# Patient Record
Sex: Female | Born: 1970 | Race: White | Hispanic: No | Marital: Married | Smoking: Former smoker
Health system: Southern US, Community
[De-identification: ages and names within clinical notes are randomized; demographics above are authoritative.]

## PROBLEM LIST (undated history)

## (undated) ENCOUNTER — Inpatient Hospital Stay (HOSPITAL_COMMUNITY): Payer: Self-pay

## (undated) DIAGNOSIS — R002 Palpitations: Secondary | ICD-10-CM

## (undated) HISTORY — PX: CHOLECYSTECTOMY: SHX55

## (undated) HISTORY — PX: TONSILLECTOMY: SUR1361

---

## 2007-02-06 ENCOUNTER — Encounter: Admission: RE | Admit: 2007-02-06 | Discharge: 2007-02-06 | Payer: Self-pay | Admitting: Family Medicine

## 2008-07-27 ENCOUNTER — Encounter: Admission: RE | Admit: 2008-07-27 | Discharge: 2008-07-27 | Payer: Self-pay | Admitting: Legal Medicine

## 2008-09-02 ENCOUNTER — Inpatient Hospital Stay (HOSPITAL_COMMUNITY): Admission: AD | Admit: 2008-09-02 | Discharge: 2008-09-02 | Payer: Self-pay | Admitting: Obstetrics and Gynecology

## 2009-01-21 ENCOUNTER — Ambulatory Visit (HOSPITAL_COMMUNITY): Admission: RE | Admit: 2009-01-21 | Discharge: 2009-01-21 | Payer: Self-pay | Admitting: Obstetrics and Gynecology

## 2009-03-18 ENCOUNTER — Ambulatory Visit (HOSPITAL_COMMUNITY): Admission: RE | Admit: 2009-03-18 | Discharge: 2009-03-18 | Payer: Self-pay | Admitting: Obstetrics and Gynecology

## 2009-04-08 ENCOUNTER — Ambulatory Visit (HOSPITAL_COMMUNITY): Admission: RE | Admit: 2009-04-08 | Discharge: 2009-04-08 | Payer: Self-pay | Admitting: Obstetrics and Gynecology

## 2009-04-27 ENCOUNTER — Inpatient Hospital Stay (HOSPITAL_COMMUNITY): Admission: AD | Admit: 2009-04-27 | Discharge: 2009-05-01 | Payer: Self-pay | Admitting: Obstetrics and Gynecology

## 2010-02-06 ENCOUNTER — Encounter: Payer: Self-pay | Admitting: Family Medicine

## 2010-04-06 LAB — CBC
HCT: 29.9 % — ABNORMAL LOW (ref 36.0–46.0)
HCT: 37.7 % (ref 36.0–46.0)
Hemoglobin: 10.4 g/dL — ABNORMAL LOW (ref 12.0–15.0)
MCHC: 34.3 g/dL (ref 30.0–36.0)
MCHC: 34.8 g/dL (ref 30.0–36.0)
MCV: 95.4 fL (ref 78.0–100.0)
Platelets: 133 10*3/uL — ABNORMAL LOW (ref 150–400)
RBC: 3.14 MIL/uL — ABNORMAL LOW (ref 3.87–5.11)
RDW: 12.4 % (ref 11.5–15.5)

## 2010-04-23 LAB — DIFFERENTIAL
Basophils Absolute: 0 10*3/uL (ref 0.0–0.1)
Basophils Relative: 0 % (ref 0–1)
Eosinophils Relative: 0 % (ref 0–5)
Lymphocytes Relative: 13 % (ref 12–46)

## 2010-04-23 LAB — CBC
HCT: 41.1 % (ref 36.0–46.0)
Hemoglobin: 14.2 g/dL (ref 12.0–15.0)
MCHC: 34.7 g/dL (ref 30.0–36.0)
MCV: 93.1 fL (ref 78.0–100.0)
RBC: 4.41 MIL/uL (ref 3.87–5.11)
RDW: 13.1 % (ref 11.5–15.5)

## 2010-04-23 LAB — RH IMMUNE GLOBULIN WORKUP (NOT WOMEN'S HOSP)

## 2010-04-23 LAB — HEPATITIS B SURFACE ANTIGEN: Hepatitis B Surface Ag: NEGATIVE

## 2010-05-29 ENCOUNTER — Emergency Department (HOSPITAL_BASED_OUTPATIENT_CLINIC_OR_DEPARTMENT_OTHER)
Admission: EM | Admit: 2010-05-29 | Discharge: 2010-05-29 | Disposition: A | Discharge status: Other Acute Inpatient Hospital | Payer: BC Managed Care – PPO | Source: Home / Self Care | Attending: Emergency Medicine | Admitting: Emergency Medicine

## 2010-05-29 ENCOUNTER — Inpatient Hospital Stay (HOSPITAL_COMMUNITY)
Admission: AD | Admit: 2010-05-29 | Discharge: 2010-06-03 | DRG: 494 | Disposition: A | Discharge status: Home / Self Care | Payer: BC Managed Care – PPO | Source: Other Acute Inpatient Hospital | Attending: General Surgery | Admitting: General Surgery

## 2010-05-29 DIAGNOSIS — Z880 Allergy status to penicillin: Secondary | ICD-10-CM

## 2010-05-29 DIAGNOSIS — R1013 Epigastric pain: Secondary | ICD-10-CM | POA: Insufficient documentation

## 2010-05-29 DIAGNOSIS — K8070 Calculus of gallbladder and bile duct without cholecystitis without obstruction: Principal | ICD-10-CM | POA: Diagnosis present

## 2010-05-29 DIAGNOSIS — Z882 Allergy status to sulfonamides status: Secondary | ICD-10-CM

## 2010-05-29 DIAGNOSIS — K219 Gastro-esophageal reflux disease without esophagitis: Secondary | ICD-10-CM | POA: Diagnosis present

## 2010-05-29 DIAGNOSIS — K8010 Calculus of gallbladder with chronic cholecystitis without obstruction: Secondary | ICD-10-CM | POA: Insufficient documentation

## 2010-05-29 DIAGNOSIS — R748 Abnormal levels of other serum enzymes: Secondary | ICD-10-CM | POA: Insufficient documentation

## 2010-05-29 LAB — CBC
Hemoglobin: 14.3 g/dL (ref 12.0–15.0)
MCH: 31 pg (ref 26.0–34.0)
MCV: 88.1 fL (ref 78.0–100.0)
Platelets: 148 10*3/uL — ABNORMAL LOW (ref 150–400)
RBC: 4.61 MIL/uL (ref 3.87–5.11)

## 2010-05-29 LAB — DIFFERENTIAL
Eosinophils Relative: 0 % (ref 0–5)
Lymphocytes Relative: 13 % (ref 12–46)
Lymphs Abs: 1 10*3/uL (ref 0.7–4.0)
Monocytes Absolute: 0.3 10*3/uL (ref 0.1–1.0)
Monocytes Relative: 4 % (ref 3–12)

## 2010-05-29 LAB — COMPREHENSIVE METABOLIC PANEL
AST: 595 U/L — ABNORMAL HIGH (ref 0–37)
Albumin: 4.3 g/dL (ref 3.5–5.2)
BUN: 7 mg/dL (ref 6–23)
CO2: 26 mEq/L (ref 19–32)
Calcium: 9.7 mg/dL (ref 8.4–10.5)
Chloride: 103 mEq/L (ref 96–112)
Creatinine, Ser: 0.5 mg/dL (ref 0.4–1.2)
GFR calc non Af Amer: >60 mL/min (ref >60)
Total Bilirubin: 1.7 mg/dL — ABNORMAL HIGH (ref 0.3–1.2)

## 2010-05-30 ENCOUNTER — Inpatient Hospital Stay (HOSPITAL_COMMUNITY): Payer: BC Managed Care – PPO

## 2010-05-30 ENCOUNTER — Other Ambulatory Visit: Payer: Self-pay | Admitting: General Surgery

## 2010-05-30 LAB — MAGNESIUM: Magnesium: 1.8 mg/dL (ref 1.5–2.5)

## 2010-05-30 LAB — COMPREHENSIVE METABOLIC PANEL
Alkaline Phosphatase: 136 U/L — ABNORMAL HIGH (ref 39–117)
CO2: 26 mEq/L (ref 19–32)
Calcium: 9.1 mg/dL (ref 8.4–10.5)
Creatinine, Ser: 0.5 mg/dL (ref 0.4–1.2)
GFR calc non Af Amer: >60 mL/min (ref >60)
Glucose, Bld: 100 mg/dL — ABNORMAL HIGH (ref 70–99)
Sodium: 140 mEq/L (ref 135–145)
Total Bilirubin: 1.1 mg/dL (ref 0.3–1.2)

## 2010-05-30 LAB — CBC
HCT: 40.5 % (ref 36.0–46.0)
Hemoglobin: 14.1 g/dL (ref 12.0–15.0)
MCH: 31.1 pg (ref 26.0–34.0)
MCHC: 34.8 g/dL (ref 30.0–36.0)

## 2010-05-30 LAB — SURGICAL PCR SCREEN: Staphylococcus aureus: POSITIVE — AB

## 2010-05-30 LAB — BASIC METABOLIC PANEL
Creatinine, Ser: 0.64 mg/dL (ref 0.4–1.2)
GFR calc Af Amer: >60 mL/min (ref >60)
GFR calc non Af Amer: >60 mL/min (ref >60)
Sodium: 139 mEq/L (ref 135–145)

## 2010-05-31 LAB — COMPREHENSIVE METABOLIC PANEL
Albumin: 3.3 g/dL — ABNORMAL LOW (ref 3.5–5.2)
Alkaline Phosphatase: 111 U/L (ref 39–117)
BUN: 4 mg/dL — ABNORMAL LOW (ref 6–23)
Creatinine, Ser: 0.52 mg/dL (ref 0.4–1.2)
Glucose, Bld: 90 mg/dL (ref 70–99)
Potassium: 3.8 mEq/L (ref 3.5–5.1)
Total Bilirubin: 0.6 mg/dL (ref 0.3–1.2)
Total Protein: 6.2 g/dL (ref 6.0–8.3)

## 2010-05-31 NOTE — Op Note (Signed)
Heather, Yoder             ACCOUNT NO.:  [id number]  MEDICAL RECORD NO.:  [id number]           PATIENT TYPE:  I  LOCATION:  5120                         FACILITY:  [facility]  PHYSICIAN:  Sharlet Salina T. Wakeelah Solan, M.D.DATE OF BIRTH:  June 18, 1970  DATE OF PROCEDURE:  05/30/2010 DATE OF DISCHARGE:                              OPERATIVE REPORT   PREOPERATIVE DIAGNOSIS:  Cholelithiasis.  POSTOPERATIVE DIAGNOSIS:  Cholelithiasis.  SURGICAL PROCEDURES:  Laparoscopic cholecystectomy with intraoperative cholangiogram and balloon catheter common bile duct exploration.  SURGEON:  Lorne Skeens. Sarahi Borland, MD  ASSISTANT:  Letha Cape, PA  BRIEF HISTORY:  [name] is a 40 year old female who has had in the past intermittent epigastric abdominal pain.  She has had a previous ultrasound showing gallstones, but due to the timing and perimeno symptoms, she elected not to have surgery.  She now was admitted yesterday with acute severe epigastric abdominal pain.  Today, the pain is significantly improved.  Repeat gallbladder ultrasound has shown sludge.  LFTs were mildly elevated on admission with bilirubin of 1.7 down to 1.1 today, although her alkaline phosphatase is up from 116-136, SGPT is down from 595-293.  I have discussed with her that she appears to attack of biliary colic may have passed or may continue to have a common bile duct stone.  We discussed management and due to the patient's improved symptoms and generally improving LFTs and normal appearance of common bile duct on ultrasound, I recommended initially laparoscopic cholecystectomy with intraoperative cholangiogram.  The nature of the procedure, its indications, risks of bleeding, infection, bile leak, bile duct injury were discussed and understood.  Now, she was brought to the operating room for this procedure.  DESCRIPTION OF OPERATION:  The patient was brought to the operating room, placed in the supine position on the  operating table, and general orotracheal anesthesia was induced.  She received preoperative IV antibiotics.  PAS were in place.  The abdomen was widely sterilely prepped and draped.  Correct patient and procedure were verified. Access was obtained with a 1-cm incision in the umbilicus.  Dissection was carried down the midline.  Fascia was incised 1-cm.  The peritoneum entered under direct vision.  Through mattress suture of 0 Vicryl, the Hasson trocar was placed and the pneumoperitoneum was established. Under direct vision, a 5-mm trocar was placed subxiphoid and two 5-mm trocars along the right subcostal margin.  The gallbladder appeared slightly distended, but not severely acutely inflamed.  The fundus was grasped, elevated up over the liver and the infundibulum retracted inferolaterally.  Peritoneum anterior-posterior to Calot triangle was incised and distal gallbladder was thoroughly dissected.  The cystic artery was skeletonized and Calot triangle was seen coursing upon the gallbladder wall.  The cystic duct was dissected free dissected at 360 degrees of the gallbladder junction.  When the anatomy was clear, the cystic artery was clipped and the cystic duct was clipped at the gallbladder junction.  Operative cholangiogram was obtained through the cystic duct.  This showed mildly dilated intrahepatic bile ducts.  There were 2 clear filling defects mobile, one migrating distally toward the ampulla with some obstruction of the  contrast.  I elected to sweep the common bile duct with a balloon catheter, at this point safely cleared. A #4 Fogarty catheter was used through one of the 5-mm trocars and was introduced without difficulty through the cystic duct into the common bile duct.  I made several passes with the balloon back up through the ampulla and then passing the catheter back down into the duodenum.  The balloon was pulled back out through the cystic duct couple of times I did not  retrieve any stones.  At this point, the Cholangiocath wall was reinserted into the cystic duct and cholangiogram repeated.  The distal common bile duct at this point appeared clear with no filling defects and good prompt flow into the duodenum.  There appeared to me to be air in the common hepatic duct rather than a definite stone.  There was no undue back pressure on the common duct.  The Cholangiocath was removed and the cystic duct was triply clipped proximally and divided, and the cystic artery previously clipped was divided.  The gallbladder was then dissected free from its bed using hook cautery, placed in an EndoCatch bag and brought out through the umbilical incision.  The right upper quadrant was irrigated and complete hemostasis assured.  Trocars were removed and all CO2 evacuated.  The mattress was suture secured in the umbilicus.  Skin incisions were closed with subcuticular Monocryl and Dermabond.  Sponge, needle, and instruments counts correct.  The patient was taken recovery in good condition.     Lorne Skeens. Savita Runner, M.D.     Tory Emerald  D:  05/30/2010  T:  05/31/2010  Job:  [id number]  Electronically Signed by Glenna Fellows M.D. on 05/31/2010 04:42:48 PM

## 2010-06-01 DIAGNOSIS — R17 Unspecified jaundice: Secondary | ICD-10-CM

## 2010-06-01 DIAGNOSIS — K8050 Calculus of bile duct without cholangitis or cholecystitis without obstruction: Secondary | ICD-10-CM

## 2010-06-01 DIAGNOSIS — R932 Abnormal findings on diagnostic imaging of liver and biliary tract: Secondary | ICD-10-CM

## 2010-06-01 LAB — CBC
Hemoglobin: 14.3 g/dL (ref 12.0–15.0)
MCV: 90 fL (ref 78.0–100.0)
Platelets: 145 10*3/uL — ABNORMAL LOW (ref 150–400)
RBC: 4.61 MIL/uL (ref 3.87–5.11)
WBC: 6.3 10*3/uL (ref 4.0–10.5)

## 2010-06-01 LAB — COMPREHENSIVE METABOLIC PANEL
AST: 272 U/L — ABNORMAL HIGH (ref 0–37)
Albumin: 3.6 g/dL (ref 3.5–5.2)
Alkaline Phosphatase: 194 U/L — ABNORMAL HIGH (ref 39–117)
CO2: 27 mEq/L (ref 19–32)
Chloride: 102 mEq/L (ref 96–112)
GFR calc Af Amer: >60 mL/min (ref >60)
GFR calc non Af Amer: >60 mL/min (ref >60)
Potassium: 3.7 mEq/L (ref 3.5–5.1)
Total Bilirubin: 4.1 mg/dL — ABNORMAL HIGH (ref 0.3–1.2)

## 2010-06-02 ENCOUNTER — Inpatient Hospital Stay (HOSPITAL_COMMUNITY): Payer: BC Managed Care – PPO

## 2010-06-02 DIAGNOSIS — R932 Abnormal findings on diagnostic imaging of liver and biliary tract: Secondary | ICD-10-CM

## 2010-06-02 DIAGNOSIS — R740 Nonspecific elevation of levels of transaminase and lactic acid dehydrogenase [LDH]: Secondary | ICD-10-CM

## 2010-06-02 DIAGNOSIS — R7401 Elevation of levels of liver transaminase levels: Secondary | ICD-10-CM

## 2010-06-02 LAB — COMPREHENSIVE METABOLIC PANEL
BUN: 9 mg/dL (ref 6–23)
Calcium: 9.9 mg/dL (ref 8.4–10.5)
Creatinine, Ser: 0.62 mg/dL (ref 0.4–1.2)
Glucose, Bld: 89 mg/dL (ref 70–99)
Total Protein: 7.4 g/dL (ref 6.0–8.3)

## 2010-06-03 DIAGNOSIS — K8050 Calculus of bile duct without cholangitis or cholecystitis without obstruction: Secondary | ICD-10-CM

## 2010-06-14 NOTE — Discharge Summary (Signed)
NAMESANIKA, BROSIOUS             ACCOUNT NO.:  [id number]  MEDICAL RECORD NO.:  [id number]           PATIENT TYPE:  I  LOCATION:  5120                         FACILITY:  [facility]  PHYSICIAN:  Sharlet Salina T. Zoeya Gramajo, M.D.DATE OF BIRTH:  [birth year]  DATE OF ADMISSION:  05/29/2010 DATE OF DISCHARGE:  05/31/2010                              DISCHARGE SUMMARY   ADMISSION DIAGNOSES:  Symptomatic cholelithiasis, possible choledocholithiasis with elevated LFTs.  DISCHARGE DIAGNOSES:  Symptomatic cholelithiasis, possible choledocholithiasis with elevated LFTs.  PROCEDURES:  Laparoscopic cholecystectomy with intraoperative cholangiogram and wound catheter, common bile duct exploration.  BRIEF HISTORY:  The patient is a 40 year old female with a history of gallstones since November 2011.  She was seen at [facility] and diagnosed with an ultrasound.  She was breastfeeding at that time and did want to reduce the risk in her milk supply with surgery.  She changed her diet and did not have any further episodes.  She has been fine until the last 2-3 weeks.  When she started having significant heartburn, bloating, and epigastric discomfort, she also had discomfort with nausea but no vomiting, fever or chills.  She was seen in the [facility] at [facility] where she was in a lot of distress.  She was transferred to [facility] and admitted by Dr. Donell Beers.  Dr. Donell Beers noted that her bilirubin was 1.7, alk phos was 116.  SGOT was 595 and SGPT was 486. The patient was admitted, placed on antibiotics for further workup and evaluation as needed.  An abdominal ultrasound was ordered along with followup labs.  PAST MEDICAL HISTORY:  Significant for gallstones and headaches.  PAST SURGICAL HISTORY:  Tonsils and adenoids, C-section, and wisdom teeth removal.  SOCIAL HISTORY:  Negative for substance use.  She is married, has a 16- year-old son.  MEDICATIONS ON ADMISSION:  Prenatal  vitamins.  ALLERGIES:  PENICILLIN, CODEINE and SULFA.  For further history and physical, please see the dictated note.  HOSPITAL COURSE:  The patient was admitted, placed on antibiotics, underwent abdominal ultrasound which showed sludge is present. Gallbladder measured 3 mm normal.  There was no sonographic Murphy sign. No pericholecystic fluid.  Common bile duct was normal.  Bilirubin came down to 1.1, alk phos was 136.  SGOT improved to 293 but SGPT was up to 548.  The patient was seen on the a.m. of May 30, 2010.  Dr. Johna Sheriff reviewed the study and recommended going forward with laparoscopic cholecystectomy.  The patient agreed.  She was taken to the OR that day and underwent laparoscopic cholecystectomy as described above.  On the intraoperative arteriogram showed mildly dilated intrahepatic ducts. There were 2 clear filling defects, one migrating distally toward the ampulla with some obstruction of contrast and Dr. Johna Sheriff elected to sweep the common bile duct with a balloon catheter.  Then a repeat cholangiogram was performed.  The reading on the cholangiogram by radiology showed air injected during the cholangiogram limited the evaluation.  However on the last runs admitted there was no distal common bile duct stones visualized.  There was narrowing of the common bile duct, may be normal that represent  spasm requiring a followup if there are persistent liver function studies.  Following a.m., the patient is eating a regular diet for supper without problems.  She was feeling much better.  Her LFTs were improving.  Total bilirubin was down to 0.6, alk phos was 111, SGOT was 91, SGPT was down to 342.  She was seen and examined by Dr. Johna Sheriff, it was his opinion. She tolerated her diet well.  She could be mobilized, discharged home. She works as a Engineer, civil (consulting) and we will plan to let her return in 1-2 weeks.  DISCHARGE ACTIVITY:  Light to moderate, no lifting over 10 pounds.   No driving while she is on Percocet.  DISCHARGE MEDICATIONS:  She will resume her preadmission prenatal vitamins, Percocet 1-2 p.o. q.4 h. P.r.n. and/or Tylenol 1-2 p.o. q.4 h. P.r.n.  She will return to see Dr. Johna Sheriff on:  June 1 at 4:30 p.m.  Wound instructions, she may wash her wounds with plain soap and water. They were closed subcuticularly with glue.  She is instructed to remove that in a week if does not come off during bathing.  She was instructed there still possibility there might be a stone and they were although was very small.  If she has a recurrence of her pain, she is to call for followup at that point.     Eber Hong, P.A.   ______________________________ Lorne Skeens. Rylin Seavey, M.D.    WDJ/MEDQ  D:  05/31/2010  T:  05/31/2010  Job:  [id number]  cc:   Malva Limes, M.D.  Electronically Signed by Sherrie George P.A. on 06/05/2010 08:42:43 PM Electronically Signed by Glenna Fellows M.D. on 06/14/2010 10:06:26 AM

## 2010-06-14 NOTE — Discharge Summary (Signed)
  NAMELAGENA, Heather Yoder             ACCOUNT NO.:  [id number]  MEDICAL RECORD NO.:  [id number]           PATIENT TYPE:  I  LOCATION:  5120                         FACILITY:  [facility]  PHYSICIAN:  Sharlet Salina T. Duc Crocket, M.D.DATE OF BIRTH:  1970/02/24  DATE OF ADMISSION:  05/29/2010 DATE OF DISCHARGE:  06/03/2010                              DISCHARGE SUMMARY   DISCHARGE DIAGNOSES: 1. Cholelithiasis. 2. History of headaches. 3. Hyperbilirubinemia.  CONSULTANTS:  Malcolm T. Russella Dar, MD, [facility], for GI.  PROCEDURES: 1. Laparoscopic cholecystectomy with intraoperative cholangiogram and     common bile duct exploration by Dr. Johna Sheriff. 2. ERCP by Dr. Russella Dar.  HISTORY OF PRESENT ILLNESS:  This is a 40 year old white female with a history of symptomatic cholelithiasis.  She was trying to put off surgery but had a severe episode and came in for evaluation.  She was admitted and proceeded with lap chole the following day.  Following that, she had some problems with pain and ileus, etc.  Labs were checked.  Her transaminases were found to be trending up including a significant rise in her bilirubinemia.  GI was consulted and they proceeded with ERCP for presumed common duct stone.  They did not find a stone.  She probably had some sphincter of Oddi spasm.  On the following day, she was much improved and was able to be discharged home in good condition.  DISCHARGE MEDICATIONS:  Percocet 5/325, take one to two p.o. q.4 h. p.r.n. pain, #40 with no refill.  CONDITION:  She may resume her prenatal vitamin.  FOLLOWUP:  The patient will need to follow up in the [facility] on June 18, 2010.  She will get LFTs done 3 days prior to that.  If she has questions or concerns, she will call.    Earney Hamburg, P.A.   ______________________________ Lorne Skeens. Claire Bridge, M.D.   MJ/MEDQ  D:  06/03/2010  T:  06/03/2010  Job:  [id number]  Electronically Signed by Charma Igo P.A. on 06/10/2010  02:25:11 PM Electronically Signed by Glenna Fellows M.D. on 06/14/2010 10:06:29 AM

## 2010-06-20 NOTE — H&P (Signed)
NAMESHAINDEL, Heather Yoder             ACCOUNT NO.:  [id number]  MEDICAL RECORD NO.:  [id number]           PATIENT TYPE:  I  LOCATION:  5120                         FACILITY:  [facility]  PHYSICIAN:  Almond Lint, MD       DATE OF BIRTH:  [birth year]  DATE OF ADMISSION:  05/29/2010 DATE OF DISCHARGE:                             HISTORY & PHYSICAL   CHIEF COMPLAINT:  Abdominal pain.  HISTORY OF PRESENT ILLNESS:  Heather Yoder is a 40 year old female who has had gallstones known since November 2011.  She went to the ER at [facility] and was diagnosed with gallstones on ultrasound.  She was breastfeeding at this time and did not want to risk reducing her milk supply with surgery.  She changed her diet around at the time and did not have anymore episodes.  She has really been fine until last 2-3 weeks when she started again with significant heartburn, bloating, and epigastric discomfort.    She has had nausea but no vomiting and no fevers or chills.  She went to the ER at [facility] last night for a severe attack in which she felt like she was dying.  PAST MEDICAL HISTORY:  Significant for gallstones and headache.  PAST SURGICAL HISTORY:  Tonsillectomy, adenoidectomy, C-section, and wisdom teeth removal.  FAMILY HISTORY:  Her father and her maternal grandfather both had gallbladder disease.  SOCIAL HISTORY:  She does not abuse substances.  She is married and has a 55-year-old son.  REVIEW OF SYSTEMS:  Otherwise negative.  MEDICATIONS:  Prenatal vitamins.  ALLERGIES:  PENICILLIN, CODEINE, and SULFA.  PHYSICAL EXAMINATION:  VITAL SIGNS:  Temperature is 98.3, heart rate is 72, blood pressure 123/58, respiratory rate 22, sats 100%. GENERAL:  Alert, oriented x3 in no acute distress. HEENT:  Normocephalic, atraumatic.  Sclerae anicteric.  Mucous membranes were moist. NECK:  Supple.  No lymphadenopathy.  No thyromegaly.  Trachea is midline. PSYCH:  Mood and affect are  normal. HEART:  Regular. LUNGS:  Clear. ABDOMEN:  Soft, nondistended.  No hepatosplenomegaly.  Tenderness in the epigastrium and right upper quadrant. EXTREMITIES:  Warm and well perfused without pitting edema.  Good strength and sensation in all 4 extremities.  Sodium 139, potassium 3.8, chloride 103, CO2 of 26, BUN 7, creatinine 0.5, glucose 108, bilirubin 1.7, ALT 46, alk phos 116.  White count 7.6, hemoglobin 14.3, hematocrit 40.6, platelet count 148,000, lipase 24.  DIAGNOSTICS:  We do not have any ultrasound here.  IMPRESSION:  Heather Yoder is a 40 year old female with symptomatic cholelithiasis, possible choledocholithiasis and suspected cholecystitis.  Her liver function tests appear that she has passed a stone.  PLAN:  IV fluids, IV antibiotics, recheck LFTs in the morning.  Right upper quadrant ultrasound to evaluate her ductal dilatation.  If her LFTs go up, then we will consult GI for possible ERCP.  If her LFTs go down, she would likely undergo laparoscopic cholecystectomy with cholangiogram tomorrow.  I will discuss this issue with Dr. Johna Sheriff in the morning who is taking over doctor of the week service.     Almond Lint, MD     FB/MEDQ  D:  05/29/2010  T:  05/30/2010  Job:  [id number]  Electronically Signed by Almond Lint MD on 06/20/2010 04:54:09 PM

## 2010-06-21 ENCOUNTER — Encounter (INDEPENDENT_AMBULATORY_CARE_PROVIDER_SITE_OTHER): Payer: Self-pay | Admitting: General Surgery

## 2011-01-19 ENCOUNTER — Other Ambulatory Visit: Payer: Self-pay | Admitting: Obstetrics & Gynecology

## 2011-07-30 IMAGING — US US OB FOLLOW-UP
1 series · 18 of 28 positions shown · non-contrast
Comparison: none

OBSTETRICAL ULTRASOUND:
 This ultrasound was performed in The [HOSPITAL], and the [facility] OB/GYN report will be stored to [REDACTED] PACS.  This report is also available in [HOSPITAL]?s accessANYware.

[Series 1: us ob follow-up · 18 of 36 slices shown]
[im 1/36]
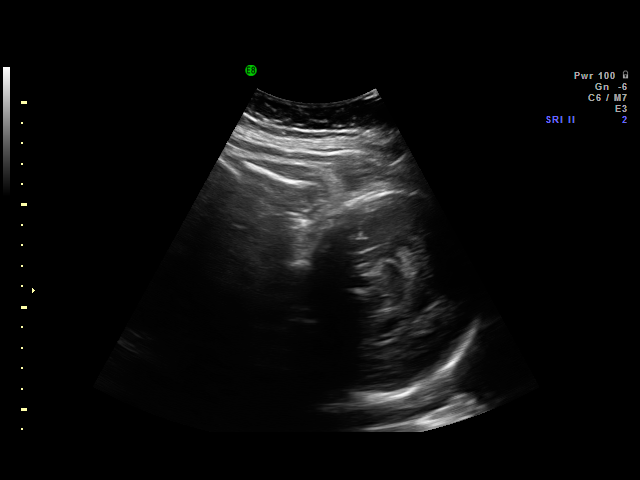
[im 3/36]
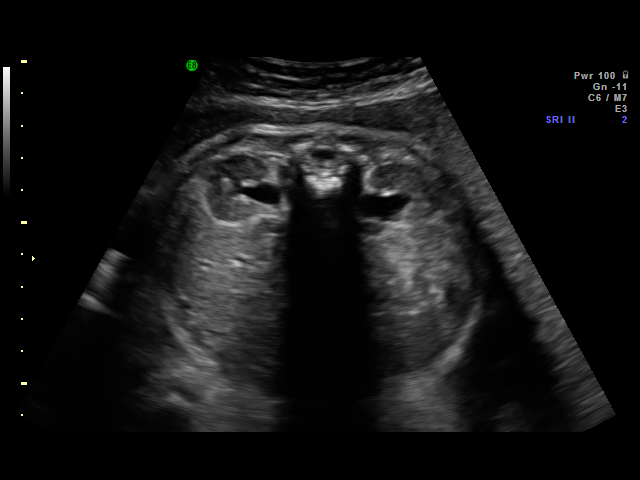
[im 4/36]
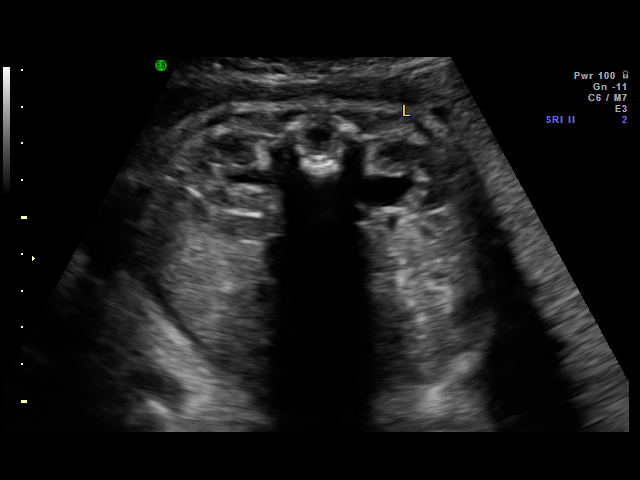
[im 7/36]
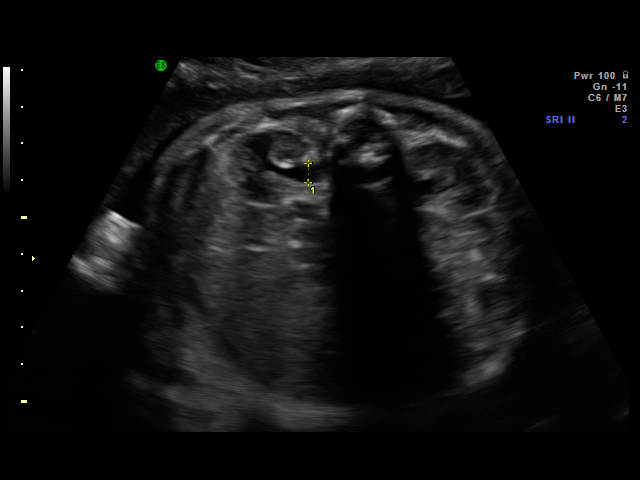
[im 10/36]
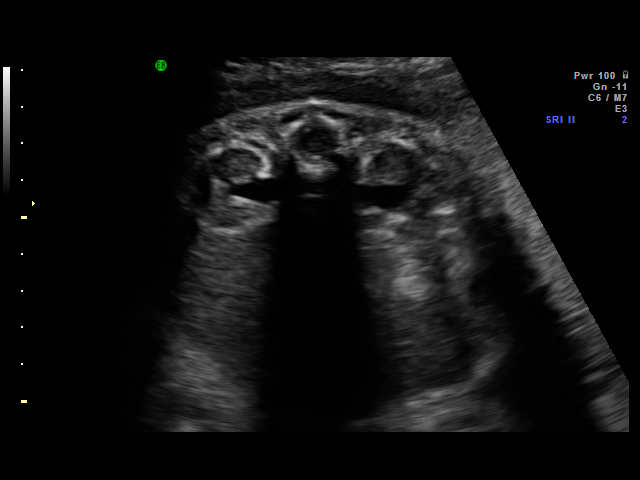
[im 11/36]
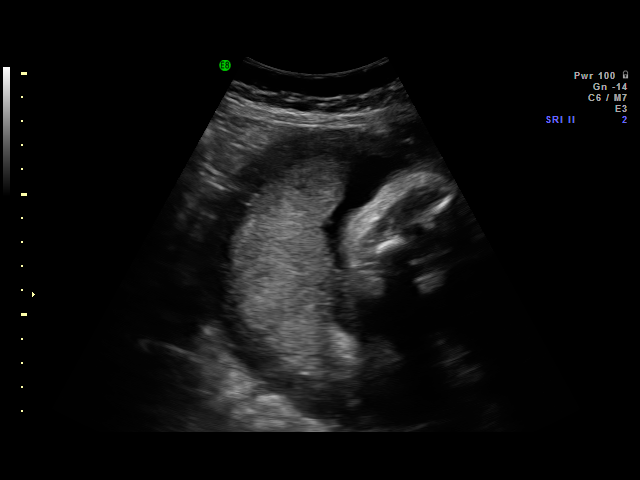
[im 13/36]
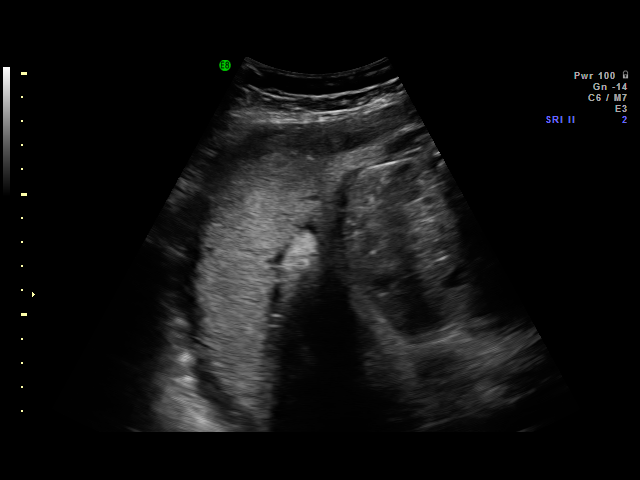
[im 15/36]
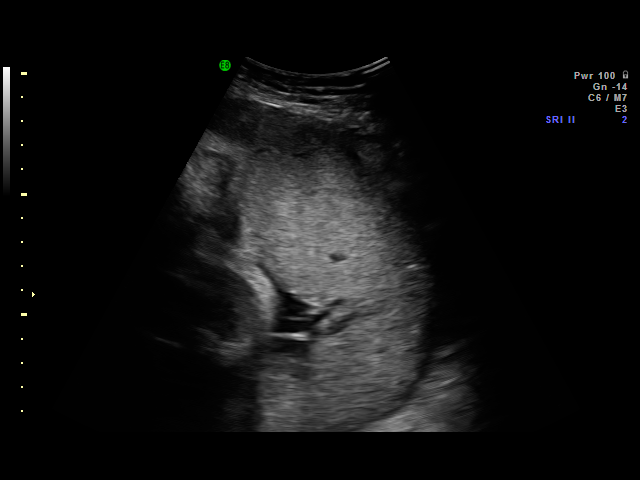
[im 17/36]
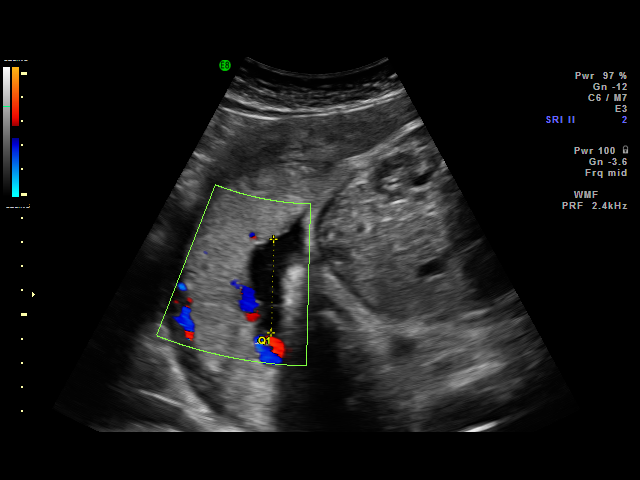
[im 19/36]
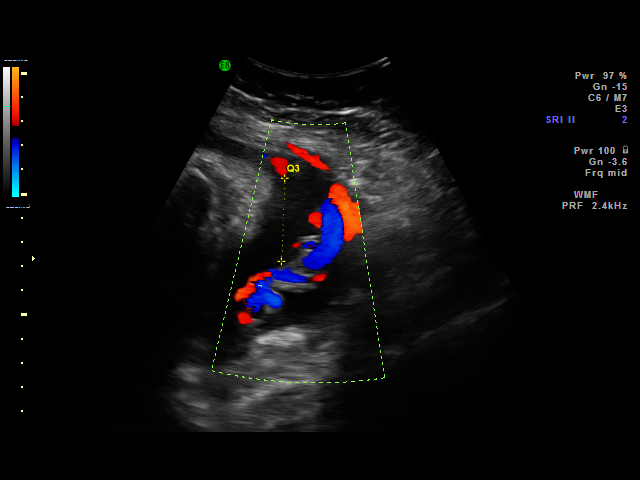
[im 21/36]
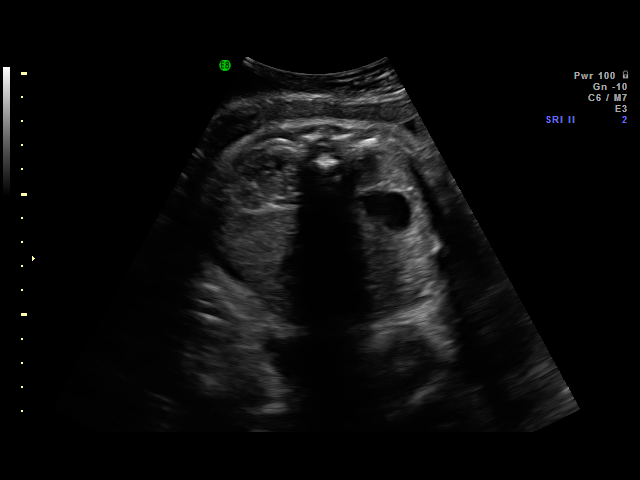
[im 23/36]
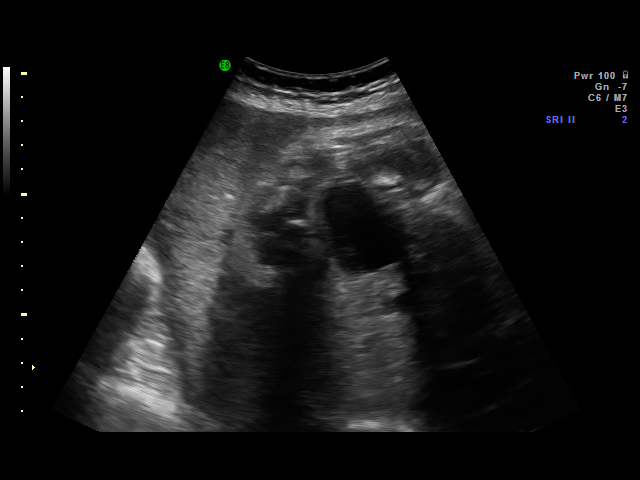
[im 25/36]
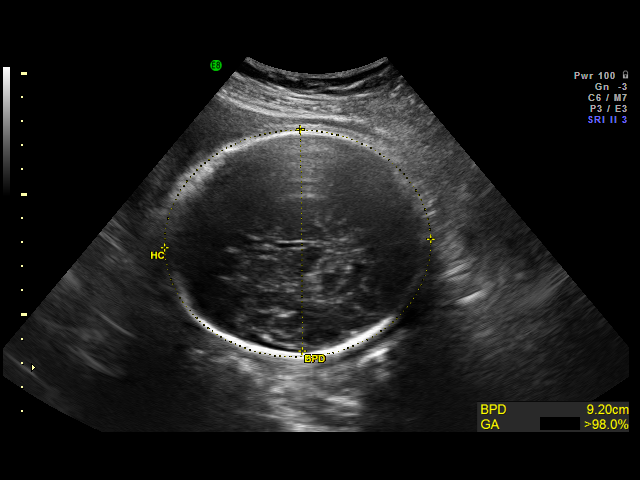
[im 28/36]
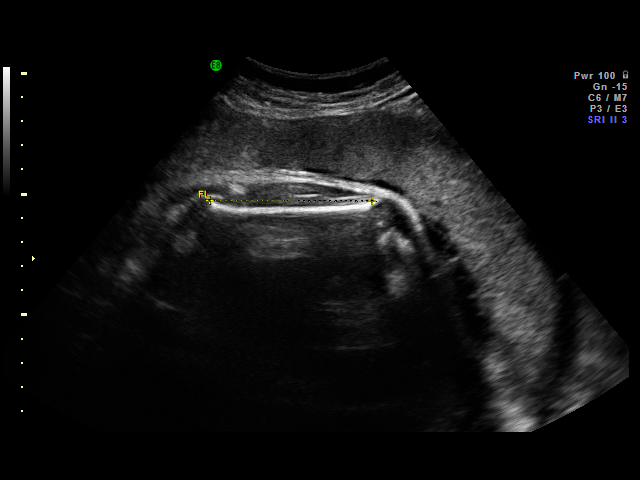
[im 29/36]
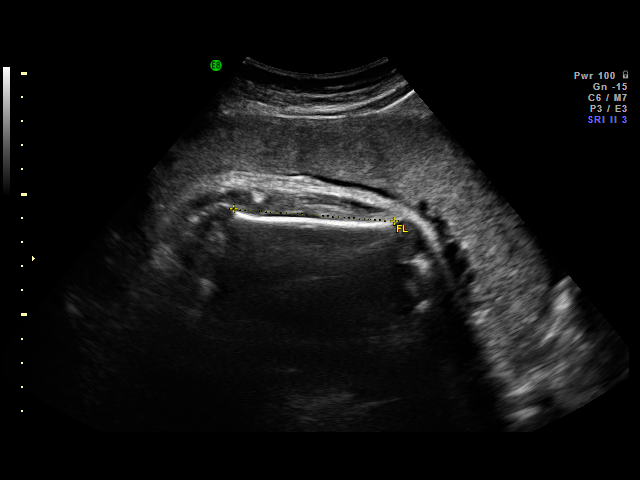
[im 32/36]
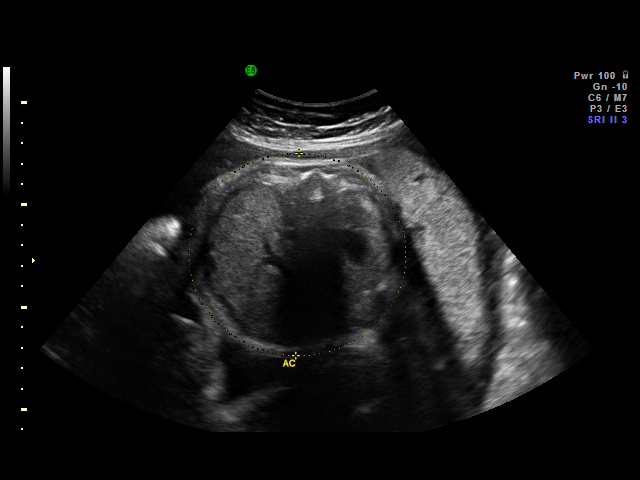
[im 33/36]
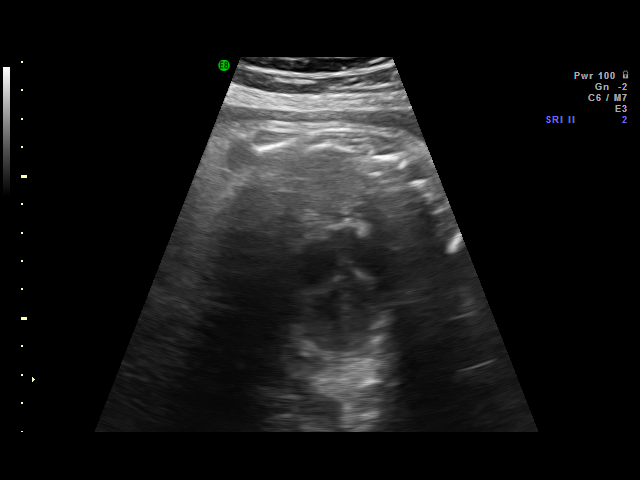
[im 36/36]
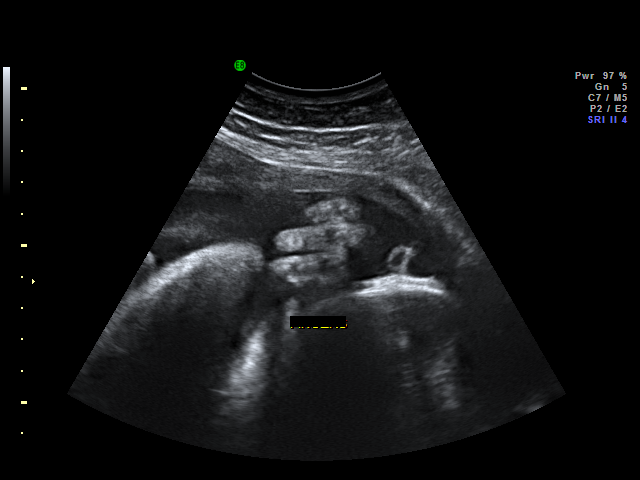

[18 of 28 positions shown; findings below may reference images not displayed]

IMPRESSION: [facility] OB/[facility]N has also been faxed to the ordering physician.

## 2011-08-20 IMAGING — US US OB FOLLOW-UP
1 series · 18 of 28 positions shown · non-contrast
Comparison: none

OBSTETRICAL ULTRASOUND:
 This ultrasound was performed in The [HOSPITAL], and the AS OB/GYN report will be stored to [REDACTED] PACS.  This report is also available in [HOSPITAL]?s accessANYware.

[Series 1: us ob follow-up · 18 of 29 slices shown]
[im 1/29]
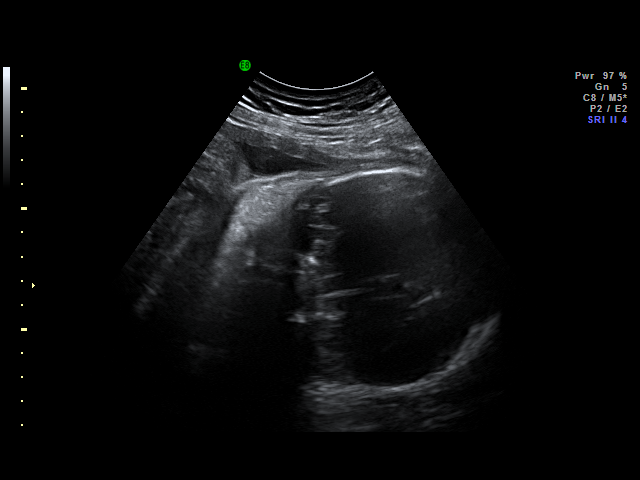
[im 3/29]
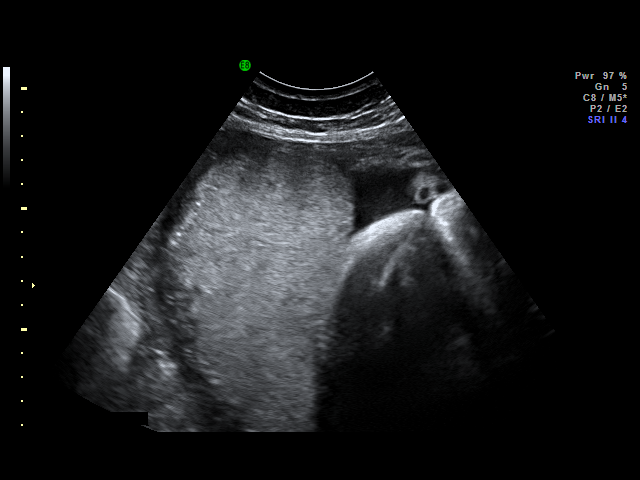
[im 4/29]
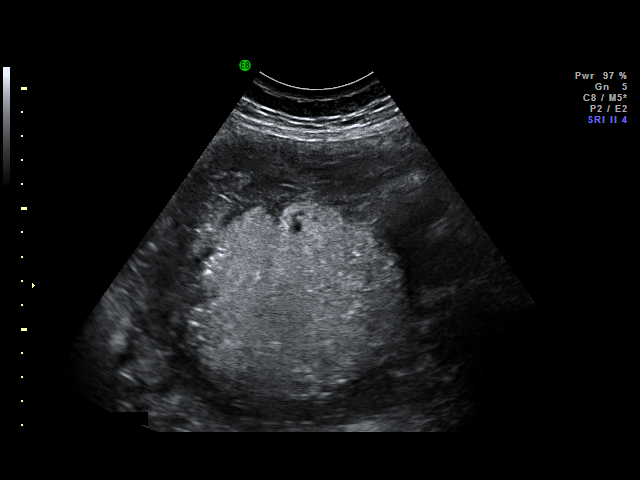
[im 6/29]
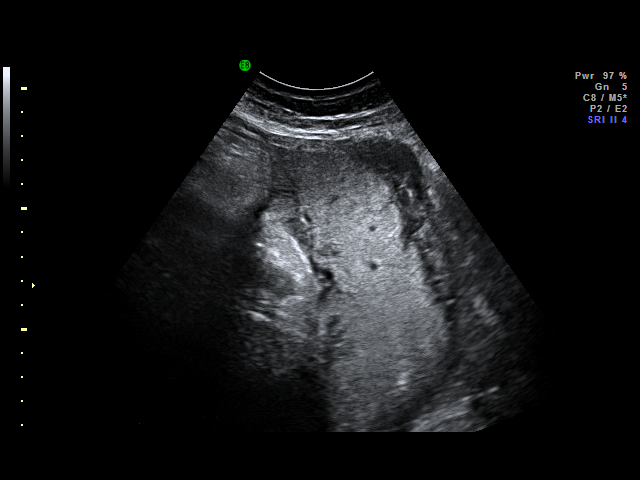
[im 8/29]
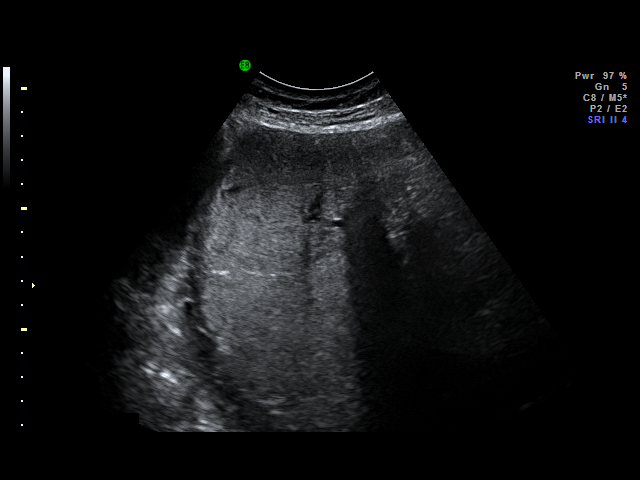
[im 9/29]
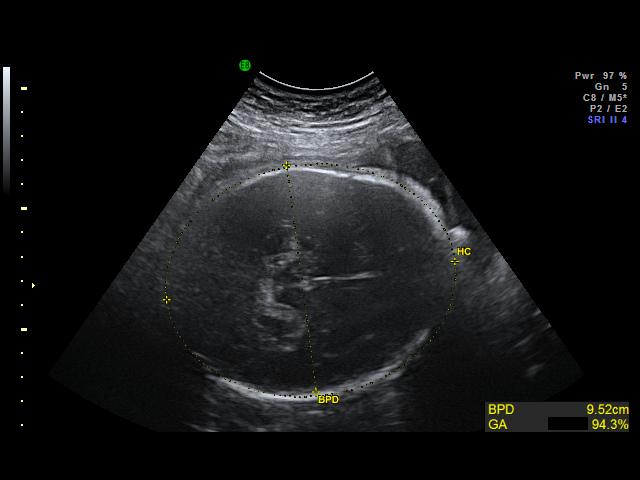
[im 11/29]
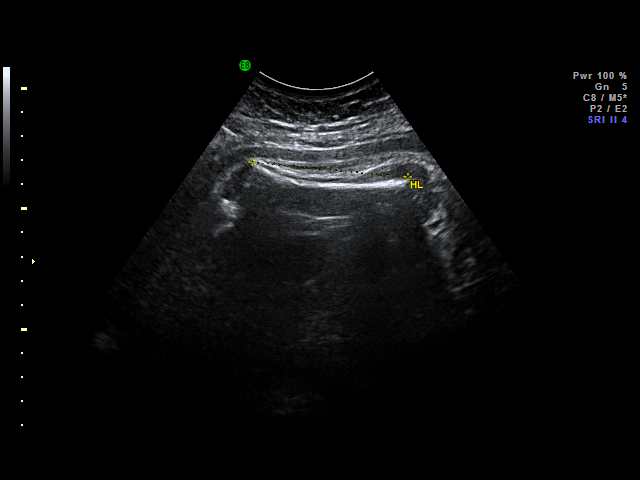
[im 12/29]
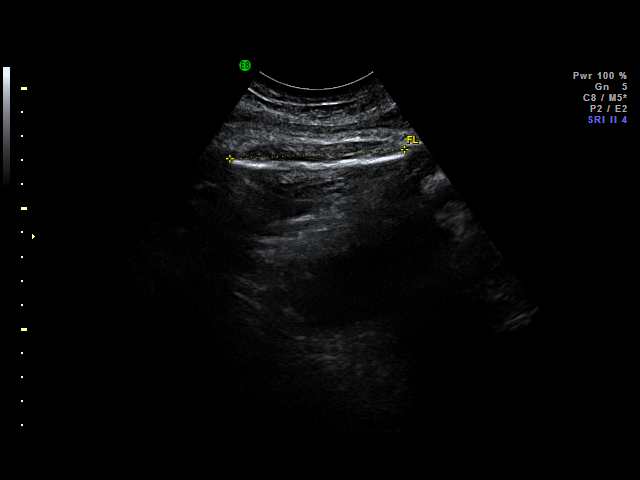
[im 14/29]
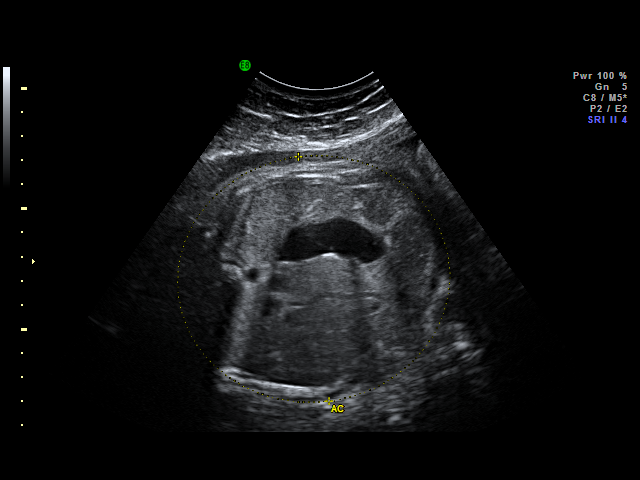
[im 15/29]
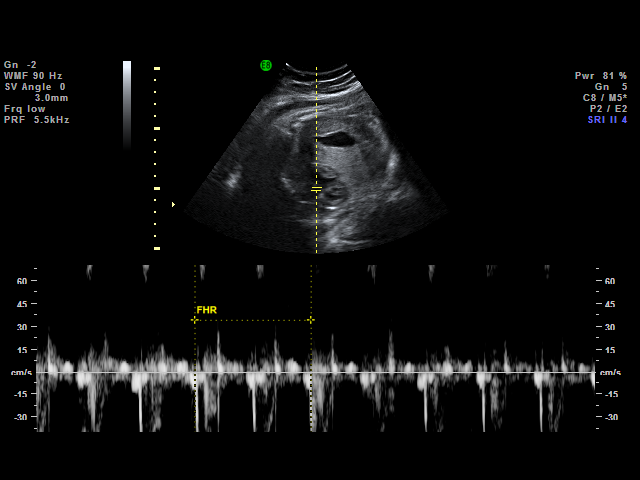
[im 17/29]
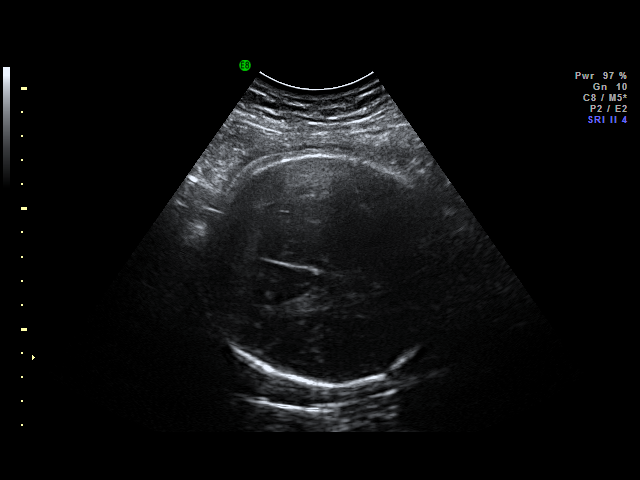
[im 18/29]
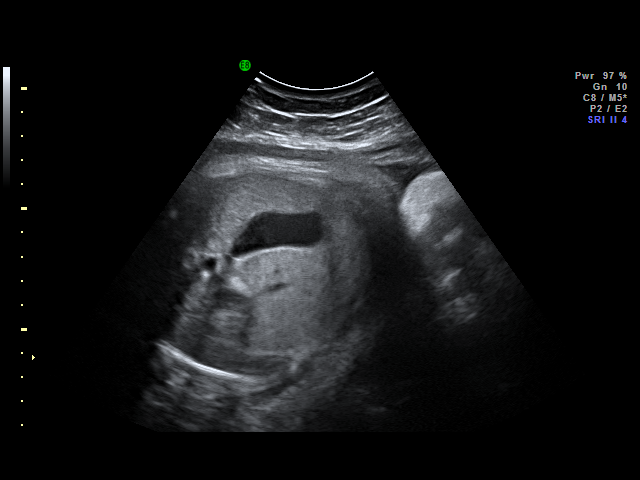
[im 20/29]
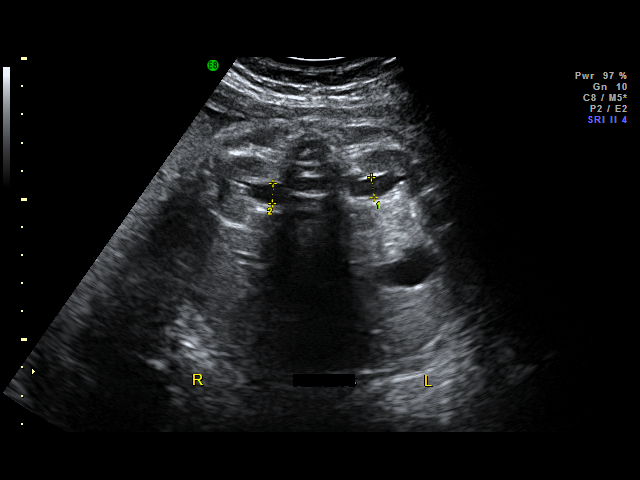
[im 22/29]
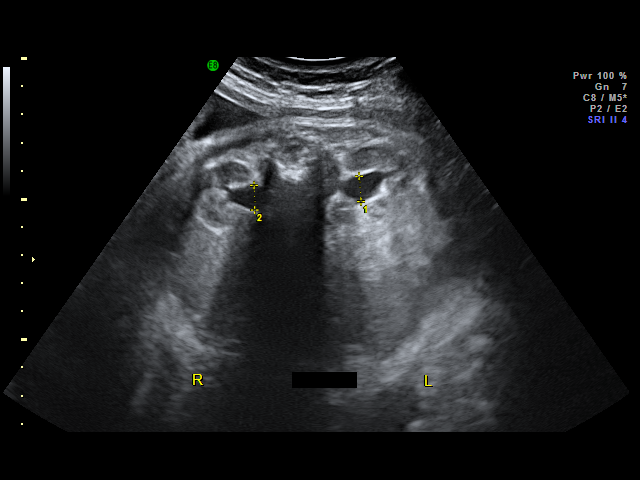
[im 23/29]
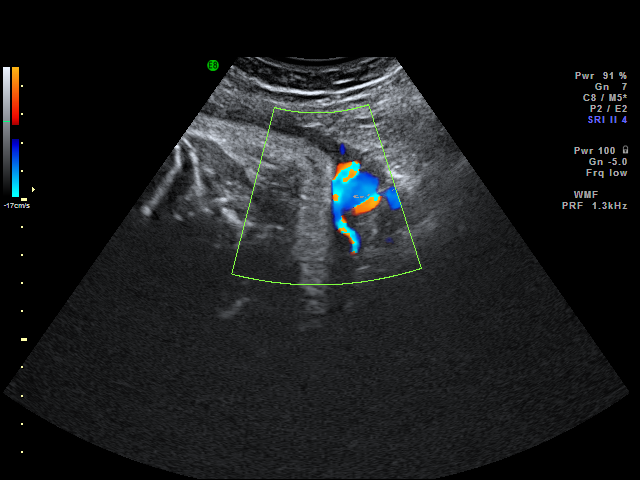
[im 25/29]
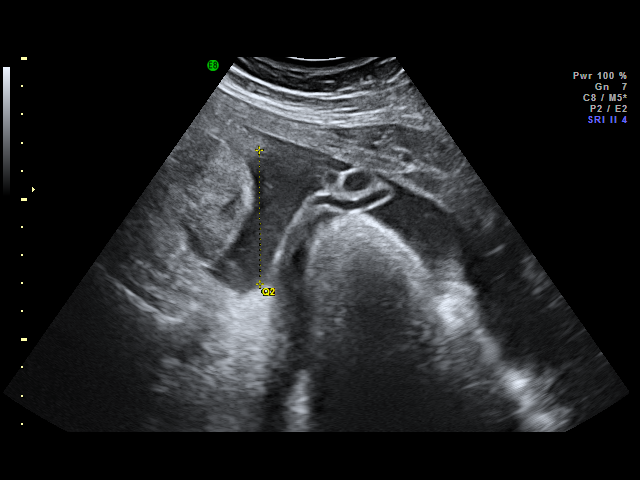
[im 26/29]
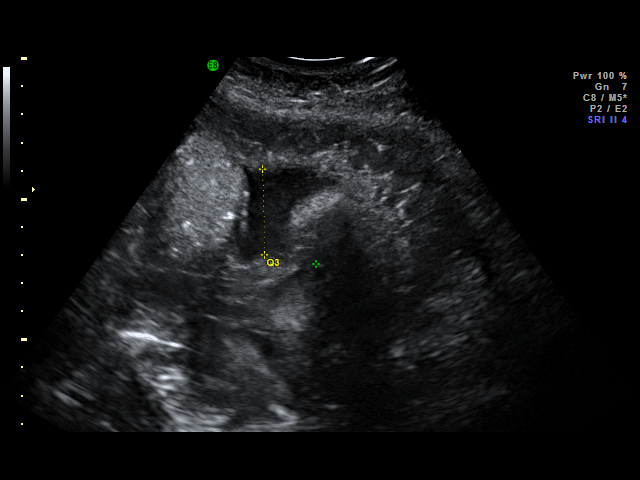
[im 29/29]
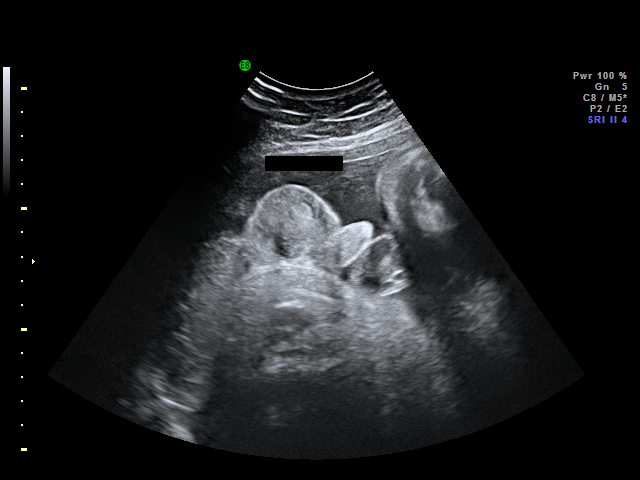

[18 of 28 positions shown; findings below may reference images not displayed]

IMPRESSION: [facility] OB/GYN has also been faxed to the ordering physician.

## 2011-11-17 ENCOUNTER — Inpatient Hospital Stay (HOSPITAL_COMMUNITY): Payer: BC Managed Care – PPO | Admitting: Anesthesiology

## 2011-11-17 ENCOUNTER — Encounter (HOSPITAL_COMMUNITY): Payer: Self-pay | Admitting: *Deleted

## 2011-11-17 ENCOUNTER — Ambulatory Visit (HOSPITAL_COMMUNITY)
Admission: AD | Admit: 2011-11-17 | Discharge: 2011-11-17 | Disposition: A | Discharge status: Home / Self Care | Payer: BC Managed Care – PPO | Source: Ambulatory Visit | Attending: Obstetrics and Gynecology | Admitting: Obstetrics and Gynecology

## 2011-11-17 ENCOUNTER — Encounter (HOSPITAL_COMMUNITY)
Admission: AD | Disposition: A | Discharge status: Home / Self Care | Payer: Self-pay | Source: Ambulatory Visit | Attending: Obstetrics and Gynecology

## 2011-11-17 ENCOUNTER — Encounter (HOSPITAL_COMMUNITY): Payer: Self-pay | Admitting: Anesthesiology

## 2011-11-17 ENCOUNTER — Ambulatory Visit: Admit: 2011-11-17 | Payer: Self-pay | Admitting: Obstetrics and Gynecology

## 2011-11-17 ENCOUNTER — Inpatient Hospital Stay (HOSPITAL_COMMUNITY): Payer: BC Managed Care – PPO

## 2011-11-17 DIAGNOSIS — O034 Incomplete spontaneous abortion without complication: Secondary | ICD-10-CM | POA: Insufficient documentation

## 2011-11-17 HISTORY — PX: DILATION AND EVACUATION: SHX1459

## 2011-11-17 HISTORY — DX: Palpitations: R00.2

## 2011-11-17 LAB — CBC
HCT: 38.9 % (ref 36.0–46.0)
Hemoglobin: 13.6 g/dL (ref 12.0–15.0)
MCV: 90.3 fL (ref 78.0–100.0)
Platelets: 132 10*3/uL — ABNORMAL LOW (ref 150–400)
RBC: 4.31 MIL/uL (ref 3.87–5.11)
WBC: 9 10*3/uL (ref 4.0–10.5)

## 2011-11-17 LAB — TYPE AND SCREEN

## 2011-11-17 SURGERY — DILATION AND EVACUATION, UTERUS
Anesthesia: Spinal | Site: Uterus | Wound class: Clean Contaminated

## 2011-11-17 MED ORDER — BUPIVACAINE IN DEXTROSE 0.75-8.25 % IT SOLN
INTRATHECAL | Status: DC | PRN
  Administered 2011-11-17: 1.5 mL via INTRATHECAL

## 2011-11-17 MED ORDER — LACTATED RINGERS IV SOLN
INTRAVENOUS | Status: DC
  Administered 2011-11-17: 16:00:00 via INTRAVENOUS

## 2011-11-17 MED ORDER — PROMETHAZINE HCL 25 MG/ML IJ SOLN: 6.2500 mg | INTRAMUSCULAR | Status: DC | PRN

## 2011-11-17 MED ORDER — FENTANYL CITRATE 0.05 MG/ML IJ SOLN: 25.0000 ug | INTRAMUSCULAR | Status: DC | PRN

## 2011-11-17 MED ORDER — MEPERIDINE HCL 25 MG/ML IJ SOLN: 6.2500 mg | INTRAMUSCULAR | Status: DC | PRN

## 2011-11-17 MED ORDER — LACTATED RINGERS IV SOLN
INTRAVENOUS | Status: DC | PRN
  Administered 2011-11-17 (×2): via INTRAVENOUS

## 2011-11-17 MED ORDER — IBUPROFEN 600 MG PO TABS: 600.0000 mg | ORAL_TABLET | Freq: Four times a day (QID) | ORAL | Status: AC | PRN

## 2011-11-17 MED ORDER — MIDAZOLAM HCL 2 MG/2ML IJ SOLN: 0.5000 mg | Freq: Once | INTRAMUSCULAR | Status: DC | PRN

## 2011-11-17 MED ORDER — KETOROLAC TROMETHAMINE 30 MG/ML IJ SOLN: 15.0000 mg | Freq: Once | INTRAMUSCULAR | Status: DC | PRN

## 2011-11-17 MED ORDER — ONDANSETRON HCL 4 MG/2ML IJ SOLN
INTRAMUSCULAR | Status: DC | PRN
  Administered 2011-11-17: 4 mg via INTRAVENOUS

## 2011-11-17 MED ORDER — LIDOCAINE HCL 1 % IJ SOLN
INTRAMUSCULAR | Status: DC | PRN
  Administered 2011-11-17: 20 mL

## 2011-11-17 MED ORDER — OXYCODONE-ACETAMINOPHEN 5-325 MG PO TABS: 2.0000 | ORAL_TABLET | ORAL | Status: AC | PRN

## 2011-11-17 SURGICAL SUPPLY — 19 items
CATH ROBINSON RED A/P 16FR (CATHETERS) ×2 IMPLANT
CLOTH BEACON ORANGE TIMEOUT ST (SAFETY) ×2 IMPLANT
DECANTER SPIKE VIAL GLASS SM (MISCELLANEOUS) ×2 IMPLANT
DILATOR CANAL MILEX (MISCELLANEOUS) IMPLANT
GLOVE BIO SURGEON STRL SZ7 (GLOVE) ×4 IMPLANT
GOWN STRL REIN XL XLG (GOWN DISPOSABLE) ×2 IMPLANT
KIT BERKELEY 1ST TRIMESTER 3/8 (MISCELLANEOUS) ×2 IMPLANT
NEEDLE SPNL 22GX3.5 QUINCKE BK (NEEDLE) ×2 IMPLANT
NS IRRIG 1000ML POUR BTL (IV SOLUTION) ×2 IMPLANT
PACK VAGINAL MINOR WOMEN LF (CUSTOM PROCEDURE TRAY) ×2 IMPLANT
PAD OB MATERNITY 4.3X12.25 (PERSONAL CARE ITEMS) ×2 IMPLANT
PAD PREP 24X48 CUFFED NSTRL (MISCELLANEOUS) ×2 IMPLANT
SET BERKELEY SUCTION TUBING (SUCTIONS) ×2 IMPLANT
SYR CONTROL 10ML LL (SYRINGE) ×2 IMPLANT
TOWEL OR 17X24 6PK STRL BLUE (TOWEL DISPOSABLE) ×4 IMPLANT
VACURETTE 10 RIGID CVD (CANNULA) IMPLANT
VACURETTE 7MM CVD STRL WRAP (CANNULA) IMPLANT
VACURETTE 8 RIGID CVD (CANNULA) IMPLANT
VACURETTE 9 RIGID CVD (CANNULA) IMPLANT

## 2011-11-17 NOTE — Anesthesia Procedure Notes (Signed)
Spinal  Patient location during procedure: OR Start time: 11/17/2011 4:05 PM Staffing Anesthesiologist: Brayton Caves R Preanesthetic Checklist Completed: patient identified, site marked, surgical consent, pre-op evaluation, timeout performed, IV checked, risks and benefits discussed and monitors and equipment checked Spinal Block Patient position: sitting Prep: DuraPrep Patient monitoring: heart rate, cardiac monitor, continuous pulse ox and blood pressure Approach: midline Location: L3-4 Injection technique: single-shot Needle Needle type: Sprotte  Needle gauge: 24 G Needle length: 9 cm Assessment Sensory level: T4 Additional Notes Patient identified.  Risk benefits discussed including failed block, incomplete pain control, headache, nerve damage, paralysis, blood pressure changes, nausea, vomiting, reactions to medication both toxic or allergic, and postpartum back pain.  Patient expressed understanding and wished to proceed.  All questions were answered.  Sterile technique used throughout procedure.  CSF was clear.  No parasthesia or other complications.  Please see nursing notes for vital signs.

## 2011-11-17 NOTE — Anesthesia Postprocedure Evaluation (Signed)
Anesthesia Post Note  Patient: Heather Yoder  Procedure(s) Performed: Procedure(s) (LRB): DILATATION AND EVACUATION (N/A)  Anesthesia type: Spinal  Patient location: PACU  Post pain: Pain level controlled  Post assessment: Post-op Vital signs reviewed  Last Vitals:  Filed Vitals:   11/17/11 1700  BP: 107/46  Pulse: 74  Temp:   Resp: 15    Post vital signs: Reviewed  Level of consciousness: awake  Complications: No apparent anesthesia complications

## 2011-11-17 NOTE — MAU Provider Note (Signed)
  History     CSN: [id number]  Arrival date and time: 11/17/11 1252   First Provider Initiated Contact with Patient 11/17/11 1312      Chief Complaint  Patient presents with  . Miscarriage   HPI  Pt is [id number]  At 11 weeks. with heavy bleeding and cramping this morning, soaking through  Pads continuously gushing when she stands up or moves.  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 Her previous miscarriages have been spontaneous at 6 to [redacted] weeks gestation without complication. She had an ultrasound last Wednesday that showed no heartbeat; she started spotting last Friday and  Gradually picked up bleeding and cramping.  She is having minimal cramps.  She took Tylenol this morning at 11 am. Pt is A neg and husband is also A negative blood type.    Past Medical History  Diagnosis Date  . Heart palpitations     Past Surgical History  Procedure Date  . Tonsillectomy   . Cesarean section   . Cholecystectomy     Family History  Problem Relation Age of Onset  . Peripheral vascular disease Father   . Heart disease Father   . Hypertension Father   . Anuerysm Father   . Hypertension Mother   . Hypothyroidism Mother   . Other Neg Hx     History  Substance Use Topics  . Smoking status: Former Games developer  . Smokeless tobacco: Never Used   Comment: 2001  . Alcohol Use: No    Allergies:  Allergies  Allergen Reactions  . Codeine   . Penicillins   . Sulfa Drugs Cross Reactors     Prescriptions prior to admission  Medication Sig Dispense Refill  . Prenatal MV-Min-Fe Fum-FA-DHA (PRENATAL 1 PO) Take by mouth.          ROS Physical Exam   Blood pressure 140/74, pulse 102, temperature 98.4 F (36.9 C), temperature source Oral, resp. rate 20, last menstrual period 09/01/2011.  Physical Exam  Vitals reviewed. Constitutional: She is oriented to person, place, and time. She appears well-developed and well-nourished.  HENT:  Head: Normocephalic.  Eyes: Pupils are equal, round, and reactive  to light.  Neck: Normal range of motion. Neck supple.  Cardiovascular: Normal rate.   Respiratory: Effort normal.  GI: Soft. There is tenderness. There is no rebound.  Genitourinary:       Mod amount of clots removed from vagina- no tissue noted; cervix dilated 2 to3 cm.  Musculoskeletal: Normal range of motion.  Neurological: She is alert and oriented to person, place, and time.  Skin: Skin is warm and dry.  Psychiatric: She has a normal mood and affect.    MAU Course  Procedures Discussed with Dr. Waynard Reeds- will obtain CBC, Type and Screen and [place] Discussed with pt and husband the probability that pt has not passed embryo - options discussed with pt and husband including surgery- pt is willing to proceed with surgery Dr. Tenny Craw to come and discuss with pt Official report pending.  Assessment and Plan  Failed IUP, bleeding D&E planned  Ngoc Daughtridge 11/17/2011, 1:12 PM

## 2011-11-17 NOTE — MAU Note (Signed)
Dx last week with a failed pregnancy, no FH on [place]- would have been 11wks.  Started bleeding yesterday afternoon- like a period, during the night passed several liver like clots.  Bleeding has gotten heavier today.  Hx of miscarriages, never bled like this before.

## 2011-11-17 NOTE — Anesthesia Preprocedure Evaluation (Signed)
Anesthesia Evaluation  Patient identified by MRN, date of birth, ID band Patient awake    Reviewed: Allergy & Precautions, H&P , Patient's Chart, lab work & pertinent test results, reviewed documented beta blocker date and time   History of Anesthesia Complications Negative for: history of anesthetic complications  Airway Mallampati: II TM Distance: >3 FB Neck ROM: full    Dental No notable dental hx.    Pulmonary neg pulmonary ROS,  breath sounds clear to auscultation  Pulmonary exam normal       Cardiovascular Exercise Tolerance: Good negative cardio ROS  Rhythm:regular Rate:Normal     Neuro/Psych negative neurological ROS  negative psych ROS   GI/Hepatic negative GI ROS, Neg liver ROS,   Endo/Other  negative endocrine ROS  Renal/GU negative Renal ROS     Musculoskeletal   Abdominal   Peds  Hematology negative hematology ROS (+)   Anesthesia Other Findings   Reproductive/Obstetrics negative OB ROS                           Anesthesia Physical  Anesthesia Plan  ASA: II and Emergent  Anesthesia Plan: Spinal   Post-op Pain Management:    Induction: Rapid sequence, Cricoid pressure planned and Intravenous  Airway Management Planned: Oral ETT  Additional Equipment:   Intra-op Plan:   Post-operative Plan:   Informed Consent: I have reviewed the patients History and Physical, chart, labs and discussed the procedure including the risks, benefits and alternatives for the proposed anesthesia with the patient or authorized representative who has indicated his/her understanding and acceptance.   Dental Advisory Given  Plan Discussed with: CRNA and Surgeon  Anesthesia Plan Comments:         Anesthesia Quick Evaluation

## 2011-11-17 NOTE — Anesthesia Preprocedure Evaluation (Deleted)
Anesthesia Evaluation  Patient identified by MRN, date of birth, ID band Patient awake    Reviewed: Allergy & Precautions, H&P , Patient's Chart, lab work & pertinent test results, reviewed documented beta blocker date and time   History of Anesthesia Complications Negative for: history of anesthetic complications  Airway Mallampati: II TM Distance: >3 FB Neck ROM: full    Dental No notable dental hx.    Pulmonary neg pulmonary ROS,  breath sounds clear to auscultation  Pulmonary exam normal       Cardiovascular Exercise Tolerance: Good negative cardio ROS  Rhythm:regular Rate:Normal     Neuro/Psych negative neurological ROS  negative psych ROS   GI/Hepatic negative GI ROS, Neg liver ROS,   Endo/Other  negative endocrine ROS  Renal/GU negative Renal ROS     Musculoskeletal   Abdominal   Peds  Hematology negative hematology ROS (+)   Anesthesia Other Findings   Reproductive/Obstetrics negative OB ROS                           Anesthesia Physical Anesthesia Plan  ASA: II and Emergent  Anesthesia Plan: General ETT   Post-op Pain Management:    Induction: Rapid sequence, Cricoid pressure planned and Intravenous  Airway Management Planned: Oral ETT  Additional Equipment:   Intra-op Plan:   Post-operative Plan:   Informed Consent: I have reviewed the patients History and Physical, chart, labs and discussed the procedure including the risks, benefits and alternatives for the proposed anesthesia with the patient or authorized representative who has indicated his/her understanding and acceptance.   Dental Advisory Given  Plan Discussed with: CRNA and Surgeon  Anesthesia Plan Comments:         Anesthesia Quick Evaluation

## 2011-11-17 NOTE — Transfer of Care (Signed)
Immediate Anesthesia Transfer of Care Note  Patient: Heather Yoder  Procedure(s) Performed: Procedure(s) (LRB) with comments: DILATATION AND EVACUATION (N/A)  Patient Location: PACU  Anesthesia Type:MAC  Level of Consciousness: awake, alert , oriented and patient cooperative  Airway & Oxygen Therapy: Breathing spont Post-op Assessment: Report given to PACU RN and Post -op Vital signs reviewed and stable  Post vital signs: Reviewed and stable  Complications: No apparent anesthesia complications

## 2011-11-17 NOTE — Op Note (Signed)
Pre-Operative Diagnosis: 1) Incomplete Abortion Postoperative Diagnosis: Same Procedure: Suction Dilation and Evacuation Surgeon: Dr. Waynard Reeds Assistant: None Operative Findings: Large amount of clot in the vagina ~500cc, Cervix visually 3-4 cm dilated.  Products of conception  Specimen: Clot and products of conception NWG:NFAOZ I/O In: 1000 [I.V.:1000] Out: 200 [Urine:200]   Heather Yoder Is a 42 year old gravida 5 para 76 who presents for definitive surgical management for an incomplete abortion. Please see the patient's history and physical for complete details of the history. Management options were discussed with the patient. R/B/A reviewed. Following appropriate informed consent was taken to the operating room. The patient was appropriately identified during a time out procedure. Spinal anesthesia was administered and the patient was placed in the dorsal lithotomy position. The patient was prepped and draped in the normal sterile fashion. A speculum was placed into the vagina, a single-tooth tenaculum was placed on the anterior lip of the cervix, and a large amount of clot was evacuated from the vagina before the cervix could be visualized.  10 cc of 1% lidocaine was administered in a paracervical fashion. Cervical dilation was not required. Products of conception were identified at the level of the internal cervical os and were removed with ring forceps. The complete gestational sac was removed intact. A 9 french suction curet was then passed to the fundus, the vacuum was engaged. Three suction passes were performed with a curette. A Sharp curettage was performed and a gritty texture was noted. A final suction pass was performed with minimal results. This completed the procedure. The patient tolerated the procedure well was brought to the recovery room in stable condition for the procedure. All sponge and needle counts correct x2.

## 2011-11-17 NOTE — H&P (Signed)
Heather Yoder is an 41 y.o. female presents with profuse vaginal bleeding The patient is a 41 yo G5P1031 who should be 11 weeks by LMP.  She was seen in the office last week and had a missed abortion confirmed by ultrasound.  The fetal po;e had been measuring 8 weeks at that time. She elected expectant management.  Spotting bleeding started 2 days ago but today increased and for the last several hours she reports gushing blood and being unable to get off the toilet due to the volume of bleeding.   Patient's last menstrual period was 09/01/2011.    Past Medical History  Diagnosis Date  . Heart palpitations     Past Surgical History  Procedure Date  . Tonsillectomy   . Cesarean section   . Cholecystectomy     Family History  Problem Relation Age of Onset  . Peripheral vascular disease Father   . Heart disease Father   . Hypertension Father   . Anuerysm Father   . Hypertension Mother   . Hypothyroidism Mother   . Other Neg Hx     Social History:  reports that she has quit smoking. She has never used smokeless tobacco. She reports that she does not drink alcohol or use illicit drugs.  Allergies:  Allergies  Allergen Reactions  . Codeine Rash  . Penicillins Rash  . Shrimp (Shellfish Allergy) Itching and Rash  . Sulfa Drugs Cross Reactors Rash    Prescriptions prior to admission  Medication Sig Dispense Refill  . acetaminophen (TYLENOL) 500 MG tablet Take 500 mg by mouth daily as needed. For pain/headache      . DISCONTD: Prenatal MV-Min-Fe Fum-FA-DHA (PRENATAL 1 PO) Take by mouth.          ROS: as above  Blood pressure 129/74, pulse 90, temperature 98.4 F (36.9 C), temperature source Oral, resp. rate 18, last menstrual period 09/01/2011. Physical Exam AOX3 abd soft Pelvic: Per NP exam large amount of clot evacuated from the vagina with continued bleeding.  Uterus tender. Cervix 2-3 cm dilated   Results for orders placed during the hospital encounter of 11/17/11  (from the past 24 hour(s))  CBC     Status: Abnormal   Collection Time   11/17/11  1:15 PM      Component Value Range   WBC 9.0  4.0 - 10.5 K/uL   RBC 4.31  3.87 - 5.11 MIL/uL   Hemoglobin 13.6  12.0 - 15.0 g/dL   HCT 16.1  09.6 - 04.5 %   MCV 90.3  78.0 - 100.0 fL   MCH 31.6  26.0 - 34.0 pg   MCHC 35.0  30.0 - 36.0 g/dL   RDW 40.9  81.1 - 91.4 %   Platelets 132 (*) 150 - 400 K/uL  TYPE AND SCREEN     Status: Normal   Collection Time   11/17/11  1:46 PM      Component Value Range   ABO/RH(D) A NEG     Antibody Screen NEG     Sample Expiration 11/20/2011      US Ob Comp Less 14 Wks  11/17/2011  OBSTETRICAL ULTRASOUND: This exam was performed within a [facility]. The OB US report was generated in the AS system, and faxed to the ordering physician.   This report is also available in [employer] and in the [employer]. See AS Obstetric US report.   US Ob Transvaginal  11/17/2011  OBSTETRICAL ULTRASOUND: This exam was  performed within a [facility]. The OB US report was generated in the AS system, and faxed to the ordering physician.   This report is also available in [employer] and in the [employer]. See AS Obstetric US report.    Assessment/Plan: 1) Prelim ultrasound shows persistent products of conception within the uterus.  Given continued bleeding theses findings are consistent with an incomplete abortion.  Management options were reviewed with the patient.  Plan to proceed with D&E for incomplete AB.  R/B/A of procedure were discussed with the patient.  Consent obtained.  Will proceed.  Heather Yoder H. 11/17/2011, 3:29 PM
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
## 2011-11-20 ENCOUNTER — Encounter (HOSPITAL_COMMUNITY): Payer: Self-pay | Admitting: Obstetrics and Gynecology

## 2011-11-21 MED FILL — Sodium Citrate & Citric Acid Soln 490-640 MG/5ML: ORAL | Qty: 30 | Status: AC

## 2011-12-07 ENCOUNTER — Encounter (HOSPITAL_COMMUNITY): Payer: Self-pay

## 2012-09-21 ENCOUNTER — Encounter (HOSPITAL_COMMUNITY): Payer: Self-pay | Admitting: *Deleted

## 2013-11-17 ENCOUNTER — Encounter (HOSPITAL_COMMUNITY): Payer: Self-pay | Admitting: *Deleted

## 2014-05-19 ENCOUNTER — Encounter (HOSPITAL_BASED_OUTPATIENT_CLINIC_OR_DEPARTMENT_OTHER): Payer: Self-pay | Admitting: Emergency Medicine

## 2014-05-19 ENCOUNTER — Emergency Department (HOSPITAL_BASED_OUTPATIENT_CLINIC_OR_DEPARTMENT_OTHER)
Admission: EM | Admit: 2014-05-19 | Discharge: 2014-05-19 | Disposition: A | Discharge status: Home / Self Care | Payer: BC Managed Care – PPO | Attending: Emergency Medicine | Admitting: Emergency Medicine

## 2014-05-19 ENCOUNTER — Emergency Department (HOSPITAL_BASED_OUTPATIENT_CLINIC_OR_DEPARTMENT_OTHER): Payer: BC Managed Care – PPO

## 2014-05-19 DIAGNOSIS — Y998 Other external cause status: Secondary | ICD-10-CM | POA: Insufficient documentation

## 2014-05-19 DIAGNOSIS — Z880 Allergy status to penicillin: Secondary | ICD-10-CM | POA: Diagnosis not present

## 2014-05-19 DIAGNOSIS — T148XXA Other injury of unspecified body region, initial encounter: Secondary | ICD-10-CM

## 2014-05-19 DIAGNOSIS — Y9389 Activity, other specified: Secondary | ICD-10-CM | POA: Diagnosis not present

## 2014-05-19 DIAGNOSIS — Y92410 Unspecified street and highway as the place of occurrence of the external cause: Secondary | ICD-10-CM | POA: Insufficient documentation

## 2014-05-19 DIAGNOSIS — Z87891 Personal history of nicotine dependence: Secondary | ICD-10-CM | POA: Diagnosis not present

## 2014-05-19 DIAGNOSIS — S299XXA Unspecified injury of thorax, initial encounter: Secondary | ICD-10-CM | POA: Insufficient documentation

## 2014-05-19 DIAGNOSIS — S40211A Abrasion of right shoulder, initial encounter: Secondary | ICD-10-CM | POA: Diagnosis not present

## 2014-05-19 DIAGNOSIS — R0789 Other chest pain: Secondary | ICD-10-CM
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
## 2021-03-07 ENCOUNTER — Emergency Department (HOSPITAL_BASED_OUTPATIENT_CLINIC_OR_DEPARTMENT_OTHER)
Admission: EM | Admit: 2021-03-07 | Discharge: 2021-03-07 | Disposition: A | Discharge status: Home / Self Care | Attending: Emergency Medicine | Admitting: Emergency Medicine

## 2021-03-07 ENCOUNTER — Encounter (HOSPITAL_BASED_OUTPATIENT_CLINIC_OR_DEPARTMENT_OTHER): Payer: Self-pay | Admitting: Emergency Medicine

## 2021-03-07 ENCOUNTER — Other Ambulatory Visit: Payer: Self-pay

## 2021-03-07 ENCOUNTER — Emergency Department (HOSPITAL_BASED_OUTPATIENT_CLINIC_OR_DEPARTMENT_OTHER)

## 2021-03-07 DIAGNOSIS — R0602 Shortness of breath: Secondary | ICD-10-CM

## 2021-03-07 DIAGNOSIS — U071 COVID-19: Secondary | ICD-10-CM | POA: Insufficient documentation

## 2021-03-07 LAB — BASIC METABOLIC PANEL
Anion gap: 6 (ref 5–15)
BUN: 12 mg/dL (ref 6–20)
CO2: 29 mmol/L (ref 22–32)
Calcium: 9.1 mg/dL (ref 8.9–10.3)
Chloride: 102 mmol/L (ref 98–111)
Creatinine, Ser: 0.81 mg/dL (ref 0.44–1.00)
GFR, Estimated: >60 mL/min (ref >60)
Glucose, Bld: 91 mg/dL (ref 70–99)
Potassium: 3.9 mmol/L (ref 3.5–5.1)
Sodium: 137 mmol/L (ref 135–145)

## 2021-03-07 LAB — CBC WITH DIFFERENTIAL/PLATELET
Abs Immature Granulocytes: 0.03 10*3/uL (ref 0.00–0.07)
Basophils Absolute: 0 10*3/uL (ref 0.0–0.1)
Basophils Relative: 0 %
Eosinophils Absolute: 0 10*3/uL (ref 0.0–0.5)
Eosinophils Relative: 0 %
HCT: 46 % (ref 36.0–46.0)
Hemoglobin: 16.2 g/dL — ABNORMAL HIGH (ref 12.0–15.0)
Immature Granulocytes: 0 %
Lymphocytes Relative: 19 %
Lymphs Abs: 1.7 10*3/uL (ref 0.7–4.0)
MCH: 30.8 pg (ref 26.0–34.0)
MCHC: 35.2 g/dL (ref 30.0–36.0)
MCV: 87.5 fL (ref 80.0–100.0)
Monocytes Absolute: 0.5 10*3/uL (ref 0.1–1.0)
Monocytes Relative: 6 %
Neutro Abs: 6.6 10*3/uL (ref 1.7–7.7)
Neutrophils Relative %: 75 %
Platelets: 191 10*3/uL (ref 150–400)
RBC: 5.26 MIL/uL — ABNORMAL HIGH (ref 3.87–5.11)
RDW: 12.3 % (ref 11.5–15.5)
WBC: 8.8 10*3/uL (ref 4.0–10.5)
nRBC: 0 % (ref 0.0–0.2)

## 2021-03-07 LAB — TROPONIN I (HIGH SENSITIVITY)
Troponin I (High Sensitivity): 3 ng/L (ref <18)
Troponin I (High Sensitivity): 3 ng/L (ref <18)

## 2021-03-07 LAB — D-DIMER, QUANTITATIVE: D-Dimer, Quant: <0.27 ug/mL-FEU (ref 0.00–0.50)

## 2021-03-07 LAB — BRAIN NATRIURETIC PEPTIDE: B Natriuretic Peptide: 26.4 pg/mL (ref 0.0–100.0)

## 2021-03-07 MED ORDER — ALBUTEROL SULFATE HFA 108 (90 BASE) MCG/ACT IN AERS
2.0000 | INHALATION_SPRAY | RESPIRATORY_TRACT | Status: DC | PRN
  Administered 2021-03-07: 2 via RESPIRATORY_TRACT
  Filled 2021-03-07: qty 6.7

## 2021-03-07 NOTE — ED Provider Notes (Signed)
[facility] Provider Note   CSN: [id number] Arrival date & time: 03/07/21  [id number]     History  Chief Complaint  Patient presents with   Shortness of Breath    Heather Yoder is a 51 y.o. female with medical history significant for heart palpitations.  Patient presents to ED for evaluation of shortness of breath that she been experiencing 1 week ago when she was diagnosed with COVID.  Patient began experiencing COVID symptoms on 2/9 and was diagnosed with at home PCR test on 2/13.  Patient reports that she is short of breath when she stands and she feels like her pulse rate increases as well.  The patient has an Apple watch which has indicated to her that she has had an increased pulse rate over the last week.  Patient reports that sitting down alleviates her shortness of breath.  The patient is endorsing shortness of breath, nausea, diarrhea, cough, fever, body aches.  Patient denies chest pain, vomiting, lightheadedness, dizziness.   Shortness of Breath Associated symptoms: cough and fever   Associated symptoms: no chest pain and no vomiting       Home Medications Prior to Admission medications   Medication Sig Start Date End Date Taking? Authorizing Provider  ibuprofen (ADVIL,MOTRIN) 600 MG tablet Take 1 tablet (600 mg total) by mouth every 6 (six) hours as needed for pain. 11/17/11   Vanessa Kick, MD  oxyCODONE-acetaminophen (ROXICET) 5-325 MG per tablet Take 2 tablets by mouth every 4 (four) hours as needed for pain. May take 1-2 tablets every 4-6 hours as needed for pain 11/17/11   Vanessa Kick, MD      Allergies    Codeine, Penicillins, Shrimp [shellfish allergy], and Sulfa drugs cross reactors    Review of Systems   Review of Systems  Constitutional:  Positive for chills and fever.  Respiratory:  Positive for cough and shortness of breath.   Cardiovascular:  Negative for chest pain.  Gastrointestinal:  Positive for diarrhea and nausea.  Negative for vomiting.  Musculoskeletal:  Positive for myalgias.  Neurological:  Negative for dizziness and light-headedness.  All other systems reviewed and are negative.  Physical Exam Updated Vital Signs BP (!) 143/87    Pulse 91    Temp 98.3 F (36.8 C) (Oral)    Resp 18    Ht 5\' 5"  (1.651 m)    Wt 63.5 kg    LMP 01/31/2021 (Approximate)    SpO2 100%    BMI 23.30 kg/m  Physical Exam Vitals and nursing note reviewed.  Constitutional:      General: She is not in acute distress.    Appearance: She is not ill-appearing, toxic-appearing or diaphoretic.  HENT:     Head: Normocephalic and atraumatic.     Nose: Nose normal.     Mouth/Throat:     Mouth: Mucous membranes are moist.  Eyes:     Extraocular Movements: Extraocular movements intact.     Pupils: Pupils are equal, round, and reactive to light.  Neck:     Vascular: No JVD.  Cardiovascular:     Rate and Rhythm: Normal rate and regular rhythm.     Heart sounds:    No friction rub.  Pulmonary:     Effort: Pulmonary effort is normal.     Breath sounds: Normal breath sounds. No decreased breath sounds, wheezing or rales.  Abdominal:     General: Bowel sounds are normal.     Palpations: Abdomen is soft.  Tenderness: There is no abdominal tenderness.  Musculoskeletal:     Cervical back: Normal range of motion and neck supple.     Right lower leg: No edema.     Left lower leg: No edema.  Skin:    General: Skin is warm and dry.     Capillary Refill: Capillary refill takes less than 2 seconds.  Neurological:     Mental Status: She is alert and oriented to person, place, and time.    ED Results / Procedures / Treatments   Labs (all labs ordered are listed, but only abnormal results are displayed) Labs Reviewed  CBC WITH DIFFERENTIAL/PLATELET - Abnormal; Notable for the following components:      Result Value   RBC 5.26 (*)    Hemoglobin 16.2 (*)    All other components within normal limits  BASIC METABOLIC PANEL   BRAIN NATRIURETIC PEPTIDE  D-DIMER, QUANTITATIVE  TROPONIN I (HIGH SENSITIVITY)  TROPONIN I (HIGH SENSITIVITY)    EKG EKG Interpretation  Date/Time:  Monday March 07 2021 11:10:11 EST Ventricular Rate:  101 PR Interval:  126 QRS Duration: 90 QT Interval:  326 QTC Calculation: 422 R Axis:   84 Text Interpretation: Sinus tachycardia with Premature supraventricular complexes Right atrial enlargement Borderline ECG When compared with ECG of 30-May-2010 18:24, PREVIOUS ECG IS PRESENT similar to prior Confirmed by Wynona Dove ([id number]) on 03/07/2021 3:45:15 PM  Radiology DG Chest Port 1 View  Result Date: 03/07/2021 CLINICAL DATA:  Shortness of breath. Loss of taste and smell. Tested positive for COVID on 02/28/2021 EXAM: PORTABLE CHEST 1 VIEW COMPARISON:  09/01/2016 FINDINGS: The heart size and mediastinal contours are within normal limits. Both lungs are clear. The visualized skeletal structures are unremarkable. IMPRESSION: No active disease. Electronically Signed   By: Nolon Nations M.D.   On: 03/07/2021 11:35    Procedures Procedures   Medications Ordered in ED Medications  albuterol (VENTOLIN HFA) 108 (90 Base) MCG/ACT inhaler 2 puff (2 puffs Inhalation Given 03/07/21 1456)    ED Course/ Medical Decision Making/ A&P                           Medical Decision Making Amount and/or Complexity of Data Reviewed Labs: ordered. Radiology: ordered.  Risk Prescription drug management.   51 year old female presents to ED for evaluation of shortness of breath that she been experiencing 1 week ago when she was diagnosed with COVID-19.  On examination, the patient is not hypoxic with an oxygen saturation of 1 high percent on room air, her pulse rate is 91, her lung sounds are clear there is no wheezing or rails, her abdomen is soft and compressible.  Patient is noted to be afebrile.  Patient worked up utilizing following labs and imaging studies interpreted by me: - Troponin 3 -  BNP 26.4 - D-dimer less than 0.27 - BMP unremarkable - CBC unremarkable - Chest x-ray does not show any signs of consolidations, effusions, mediastinal widening, pneumothorax  I ambulated the patient and she displayed the ability to maintain oxygen saturation of 100 percent.  The patient's pulse did increase to 120 bpm however when she sat down her pulse decreased back down to 85 bpm.  After discussion with my attending, Dr. Pearline Cables, I feel this patient is stable for discharge home.  I have advised the patient that she will most likely feel ill for the next couple of days however if this should abate in the next 5  days.  I advised the patient to follow-up with her PCP in the event that her shortness of breath does not dissipate.  I have also advised the patient to follow-up with her cardiologist as she states that she feels like she is having PVCs.  When I was in there with the patient, the monitor did not show any signs of PVC.  I provided the patient with albuterol inhaler which she states helps her breathing.  I provided the patient with return precautions and she voiced understanding.  I answered all the patient's questions to her satisfaction.  At this time, I feel the patient is stable for discharge home.   Final Clinical Impression(s) / ED Diagnoses Final diagnoses:  Shortness of breath    Rx / DC Orders ED Discharge Orders     None         Azucena Cecil, PA-C 03/07/21 1600    Jeanell Sparrow, DO 03/09/21 1307

## 2021-03-07 NOTE — Discharge Instructions (Signed)
Return to ED with any new or worsening signs or symptoms of chest pain, shortness of breath, inability to take deep breaths You may purchase an at home pulse oximeter to evaluate your oxygen saturation at home.  Your oxygen saturation between 94% and 100%. Please maintain hydration while recovering from your viral illness.  I prefer Pedialyte. Please eat a diet that is agreeable.  Bananas, rice, applesauce, toast, chicken no soup are all preferable Please read the attached informational brochures concerning 10 things you can do to manage your COVID-19 Please follow-up with your cardiologist as we discussed for your concern of PVCs.

## 2021-03-07 NOTE — ED Triage Notes (Signed)
Pt states she was diagnosed with covid 2/13.  Pt states she is having rapid heartbeat with sob with exertion x 1 week.  Pt states she has a lot of fatigue.  Pt has decreased po intake.  Pulse  ox 100%, HR 99 at rest

## 2021-03-07 NOTE — ED Notes (Signed)
Patient seen before, HR 103, SpO2 100% in no resp distress.  SOB & rapid HR mainly with exertion.

## 2021-03-07 NOTE — ED Notes (Signed)
Last Monday tested positive for Covid and still having shortness of breath with exertion.  Pt. Reports feeling HR go up.  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
